# Patient Record
Sex: Female | Born: 1970 | Race: Black or African American | Hispanic: No | Smoking: Current every day smoker
Health system: Southern US, Community
[De-identification: ages and names within clinical notes are randomized; demographics above are authoritative.]

## PROBLEM LIST (undated history)

## (undated) HISTORY — PX: CARPAL TUNNEL RELEASE: SHX101

## (undated) HISTORY — PX: CHOLECYSTECTOMY: SHX55

## (undated) HISTORY — PX: ABDOMINAL HYSTERECTOMY: SHX81

---

## 2019-10-07 ENCOUNTER — Other Ambulatory Visit: Payer: Self-pay

## 2019-10-07 ENCOUNTER — Emergency Department (HOSPITAL_BASED_OUTPATIENT_CLINIC_OR_DEPARTMENT_OTHER)
Admission: EM | Admit: 2019-10-07 | Discharge: 2019-10-07 | Disposition: A | Discharge status: Home / Self Care | Payer: BLUE CROSS/BLUE SHIELD | Attending: Emergency Medicine | Admitting: Emergency Medicine

## 2019-10-07 ENCOUNTER — Emergency Department (HOSPITAL_BASED_OUTPATIENT_CLINIC_OR_DEPARTMENT_OTHER): Payer: BLUE CROSS/BLUE SHIELD

## 2019-10-07 ENCOUNTER — Encounter (HOSPITAL_BASED_OUTPATIENT_CLINIC_OR_DEPARTMENT_OTHER): Payer: Self-pay | Admitting: Emergency Medicine

## 2019-10-07 DIAGNOSIS — Z885 Allergy status to narcotic agent status: Secondary | ICD-10-CM | POA: Diagnosis not present

## 2019-10-07 DIAGNOSIS — F17210 Nicotine dependence, cigarettes, uncomplicated: Secondary | ICD-10-CM | POA: Diagnosis not present

## 2019-10-07 DIAGNOSIS — R42 Dizziness and giddiness: Secondary | ICD-10-CM | POA: Diagnosis present

## 2019-10-07 DIAGNOSIS — Z91018 Allergy to other foods: Secondary | ICD-10-CM | POA: Diagnosis not present

## 2019-10-07 DIAGNOSIS — G44209 Tension-type headache, unspecified, not intractable: Secondary | ICD-10-CM | POA: Diagnosis not present

## 2019-10-07 LAB — CBC WITH DIFFERENTIAL/PLATELET
Abs Immature Granulocytes: 0.01 10*3/uL (ref 0.00–0.07)
Basophils Absolute: 0.1 10*3/uL (ref 0.0–0.1)
Basophils Relative: 1 %
Eosinophils Absolute: 0.1 10*3/uL (ref 0.0–0.5)
Eosinophils Relative: 2 %
HCT: 46.6 % — ABNORMAL HIGH (ref 36.0–46.0)
Hemoglobin: 15.7 g/dL — ABNORMAL HIGH (ref 12.0–15.0)
Immature Granulocytes: 0 %
Lymphocytes Relative: 54 %
Lymphs Abs: 3.7 10*3/uL (ref 0.7–4.0)
MCH: 30.9 pg (ref 26.0–34.0)
MCHC: 33.7 g/dL (ref 30.0–36.0)
MCV: 91.7 fL (ref 80.0–100.0)
Monocytes Absolute: 0.5 10*3/uL (ref 0.1–1.0)
Monocytes Relative: 7 %
Neutro Abs: 2.5 10*3/uL (ref 1.7–7.7)
Neutrophils Relative %: 36 %
Platelets: 171 10*3/uL (ref 150–400)
RBC: 5.08 MIL/uL (ref 3.87–5.11)
RDW: 12.9 % (ref 11.5–15.5)
WBC: 6.9 10*3/uL (ref 4.0–10.5)
nRBC: 0 % (ref 0.0–0.2)

## 2019-10-07 LAB — BASIC METABOLIC PANEL
Anion gap: 7 (ref 5–15)
BUN: 17 mg/dL (ref 6–20)
CO2: 24 mmol/L (ref 22–32)
Calcium: 9 mg/dL (ref 8.9–10.3)
Chloride: 112 mmol/L — ABNORMAL HIGH (ref 98–111)
Creatinine, Ser: 0.93 mg/dL (ref 0.44–1.00)
GFR calc Af Amer: >60 mL/min (ref >60)
GFR calc non Af Amer: >60 mL/min (ref >60)
Glucose, Bld: 109 mg/dL — ABNORMAL HIGH (ref 70–99)
Potassium: 3.8 mmol/L (ref 3.5–5.1)
Sodium: 143 mmol/L (ref 135–145)

## 2019-10-07 MED ORDER — IOHEXOL 350 MG/ML SOLN
75.0000 mL | Freq: Once | INTRAVENOUS | Status: AC | PRN
  Administered 2019-10-07: 22:00:00 100 mL via INTRAVENOUS

## 2019-10-07 MED ORDER — ACETAMINOPHEN 500 MG PO TABS
500.0000 mg | ORAL_TABLET | Freq: Once | ORAL | Status: AC
  Administered 2019-10-07: 500 mg via ORAL
  Filled 2019-10-07: qty 1

## 2019-10-07 MED ORDER — NAPROXEN 500 MG PO TABS: 500.0000 mg | ORAL_TABLET | Freq: Two times a day (BID) | ORAL | 0 refills | Status: AC | PRN

## 2019-10-07 MED ORDER — DIPHENHYDRAMINE HCL 50 MG/ML IJ SOLN
25.0000 mg | Freq: Once | INTRAMUSCULAR | Status: AC
  Administered 2019-10-07: 25 mg via INTRAVENOUS
  Filled 2019-10-07: qty 1

## 2019-10-07 MED ORDER — PROCHLORPERAZINE EDISYLATE 10 MG/2ML IJ SOLN
10.0000 mg | Freq: Once | INTRAMUSCULAR | Status: AC
  Administered 2019-10-07: 10 mg via INTRAVENOUS
  Filled 2019-10-07: qty 2

## 2019-10-07 NOTE — ED Triage Notes (Signed)
Has been having dizziness yesterday at work, woke up this am it was worse, with blurred vision. Dizziness worse when turning head from side to side, denies n/v or head trauma. Also c/o of h/a

## 2019-10-07 NOTE — Discharge Instructions (Signed)
Take Tylenol and be prescribed naproxen for pain control.  If you develop worsening dizziness, episodes of passing out, chest pain, difficulty breathing, numbness, weakness or other new concerning symptom, please return to ER for reassessment.

## 2019-10-07 NOTE — ED Notes (Signed)
Pt to CT at this time.

## 2019-10-07 NOTE — ED Provider Notes (Signed)
[facility] EMERGENCY DEPARTMENT Provider Note   CSN: [id number] Arrival date & time: 10/07/19  2036     History   Chief Complaint Chief Complaint  Patient presents with   Dizziness    HPI Latasha Mcdaniel is a 48 y.o. female.  Presents emerge department with new onset headache, neck pain.  Patient states woke up this morning with new severe headache, right-sided, extended down to right lateral neck.  States headache has been relatively constant throughout the day, up to 10 out of 10 in severity, currently 8 out of 10 in severity.  Worse with bright lights.  Has had associated episodes of dizzy spells.  States these occur whenever she has sudden movements.  Currently not feeling dizzy.  No episodes of passing out, no chest pain or difficulty breathing.  No numbness, weakness, vision changes, gait changes.  Tried taking Motrin earlier today, moderate improvement in symptoms. No fever, no neck stiffness.    HPI  History reviewed. No pertinent past medical history.  There are no active problems to display for this patient.   Past Surgical History:  Procedure Laterality Date   ABDOMINAL HYSTERECTOMY     CARPAL TUNNEL RELEASE Bilateral    CHOLECYSTECTOMY       OB History   No obstetric history on file.      Home Medications    Prior to Admission medications   Medication Sig Start Date End Date Taking? Authorizing Provider  naproxen (NAPROSYN) 500 MG tablet Take 1 tablet (500 mg total) by mouth 2 (two) times daily as needed. 10/07/19   Milagros Loll, MD    Family History No family history on file.  Social History Social History   Tobacco Use   Smoking status: Current Every Day Smoker    Packs/day: 0.50    Types: Cigarettes   Smokeless tobacco: Never Used  Substance Use Topics   Alcohol use: Yes    Comment: daily wine   Drug use: Never     Allergies   Fish allergy and Lortab [hydrocodone-acetaminophen]   Review of Systems Review of  Systems  Constitutional: Negative for chills and fever.  HENT: Negative for ear pain and sore throat.   Eyes: Negative for pain and visual disturbance.  Respiratory: Negative for cough and shortness of breath.   Cardiovascular: Negative for chest pain and palpitations.  Gastrointestinal: Negative for abdominal pain and vomiting.  Genitourinary: Negative for dysuria and hematuria.  Musculoskeletal: Negative for arthralgias and back pain.  Skin: Negative for color change and rash.  Neurological: Positive for headaches. Negative for seizures and syncope.  All other systems reviewed and are negative.    Physical Exam Updated Vital Signs BP (!) 158/98 (BP Location: Right Arm)    Pulse 90    Temp 98.2 F (36.8 C) (Oral)    Resp 18    Ht  (1.549 m)    Wt 93.1 kg    SpO2 99%    BMI 38.77 kg/m   Physical Exam Vitals signs and nursing note reviewed.  Constitutional:      General: She is not in acute distress.    Appearance: She is well-developed.  HENT:     Head: Normocephalic and atraumatic.  Eyes:     Conjunctiva/sclera: Conjunctivae normal.  Neck:     Musculoskeletal: Neck supple.     Comments: Normal neck range of motion, no midline C-spine tenderness Cardiovascular:     Rate and Rhythm: Normal rate and regular rhythm.  Heart sounds: No murmur.  Pulmonary:     Effort: Pulmonary effort is normal. No respiratory distress.     Breath sounds: Normal breath sounds.  Abdominal:     Palpations: Abdomen is soft.     Tenderness: There is no abdominal tenderness.  Skin:    General: Skin is warm and dry.  Neurological:     Mental Status: She is alert.     Comments: Alert, oriented x3, cranial nerves II through XII intact, 5 out of 5 strength in bilateral upper and lower extremities, sensation to light touch intact in all 4 extremities, normal finger-nose-finger, normal gait, no pronator drift, normal visual fields, normal speech      ED Treatments / Results  Labs (all labs  ordered are listed, but only abnormal results are displayed) Labs Reviewed  CBC WITH DIFFERENTIAL/PLATELET - Abnormal; Notable for the following components:      Result Value   Hemoglobin 15.7 (*)    HCT 46.6 (*)    All other components within normal limits  BASIC METABOLIC PANEL - Abnormal; Notable for the following components:   Chloride 112 (*)    Glucose, Bld 109 (*)    All other components within normal limits    EKG None  Radiology Ct Angio Head W Or Wo Contrast  Result Date: 10/07/2019 CLINICAL DATA:  Thunderclap headache EXAM: CT ANGIOGRAPHY HEAD AND NECK TECHNIQUE: Multidetector CT imaging of the head and neck was performed using the standard protocol during bolus administration of intravenous contrast. Multiplanar CT image reconstructions and MIPs were obtained to evaluate the vascular anatomy. Carotid stenosis measurements (when applicable) are obtained utilizing NASCET criteria, using the distal internal carotid diameter as the denominator. CONTRAST:  100mL OMNIPAQUE IOHEXOL 350 MG/ML SOLN COMPARISON:  None. FINDINGS: CT HEAD FINDINGS Brain: There is no mass, hemorrhage or extra-axial collection. The size and configuration of the ventricles and extra-axial CSF spaces are normal. There is no acute or chronic infarction. The brain parenchyma is normal. Skull: The visualized skull base, calvarium and extracranial soft tissues are normal. Sinuses/Orbits: No fluid levels or advanced mucosal thickening of the visualized paranasal sinuses. No mastoid or middle ear effusion. The orbits are normal. CTA NECK FINDINGS SKELETON: There is no bony spinal canal stenosis. No lytic or blastic lesion. OTHER NECK: Normal pharynx, larynx and major salivary glands. No cervical lymphadenopathy. Unremarkable thyroid gland. UPPER CHEST: No pneumothorax or pleural effusion. No nodules or masses. AORTIC ARCH: There is no calcific atherosclerosis of the aortic arch. There is no aneurysm, dissection or  hemodynamically significant stenosis of the visualized portion of the aorta. Conventional 3 vessel aortic branching pattern. The visualized proximal subclavian arteries are widely patent. RIGHT CAROTID SYSTEM: Normal without aneurysm, dissection or stenosis. LEFT CAROTID SYSTEM: Normal without aneurysm, dissection or stenosis. VERTEBRAL ARTERIES: Left dominant configuration. Both origins are clearly patent. There is no dissection, occlusion or flow-limiting stenosis to the skull base (V1-V3 segments). CTA HEAD FINDINGS POSTERIOR CIRCULATION: --Vertebral arteries: Normal V4 segments. --Posterior inferior cerebellar arteries (PICA): Patent origins from the vertebral arteries. --Anterior inferior cerebellar arteries (AICA): Patent origins from the basilar artery. --Basilar artery: Normal. --Superior cerebellar arteries: Normal. --Posterior cerebral arteries: Normal. The right PCA is predominantly supplied by the posterior communicating artery. ANTERIOR CIRCULATION: --Intracranial internal carotid arteries: Normal. --Anterior cerebral arteries (ACA): Normal. Both A1 segments are present. Patent anterior communicating artery (a-comm). --Middle cerebral arteries (MCA): Normal. VENOUS SINUSES: As permitted by contrast timing, patent. ANATOMIC VARIANTS: Fetal origin of the right PCA Review  of the MIP images confirms the above findings. IMPRESSION: Normal CTA of the head and neck. Electronically Signed   By: Ulyses Jarred M.D.   On: 10/07/2019 22:31   Ct Angio Neck W And/or Wo Contrast  Result Date: 10/07/2019 CLINICAL DATA:  Thunderclap headache EXAM: CT ANGIOGRAPHY HEAD AND NECK TECHNIQUE: Multidetector CT imaging of the head and neck was performed using the standard protocol during bolus administration of intravenous contrast. Multiplanar CT image reconstructions and MIPs were obtained to evaluate the vascular anatomy. Carotid stenosis measurements (when applicable) are obtained utilizing NASCET criteria, using the  distal internal carotid diameter as the denominator. CONTRAST:  129mL OMNIPAQUE IOHEXOL 350 MG/ML SOLN COMPARISON:  None. FINDINGS: CT HEAD FINDINGS Brain: There is no mass, hemorrhage or extra-axial collection. The size and configuration of the ventricles and extra-axial CSF spaces are normal. There is no acute or chronic infarction. The brain parenchyma is normal. Skull: The visualized skull base, calvarium and extracranial soft tissues are normal. Sinuses/Orbits: No fluid levels or advanced mucosal thickening of the visualized paranasal sinuses. No mastoid or middle ear effusion. The orbits are normal. CTA NECK FINDINGS SKELETON: There is no bony spinal canal stenosis. No lytic or blastic lesion. OTHER NECK: Normal pharynx, larynx and major salivary glands. No cervical lymphadenopathy. Unremarkable thyroid gland. UPPER CHEST: No pneumothorax or pleural effusion. No nodules or masses. AORTIC ARCH: There is no calcific atherosclerosis of the aortic arch. There is no aneurysm, dissection or hemodynamically significant stenosis of the visualized portion of the aorta. Conventional 3 vessel aortic branching pattern. The visualized proximal subclavian arteries are widely patent. RIGHT CAROTID SYSTEM: Normal without aneurysm, dissection or stenosis. LEFT CAROTID SYSTEM: Normal without aneurysm, dissection or stenosis. VERTEBRAL ARTERIES: Left dominant configuration. Both origins are clearly patent. There is no dissection, occlusion or flow-limiting stenosis to the skull base (V1-V3 segments). CTA HEAD FINDINGS POSTERIOR CIRCULATION: --Vertebral arteries: Normal V4 segments. --Posterior inferior cerebellar arteries (PICA): Patent origins from the vertebral arteries. --Anterior inferior cerebellar arteries (AICA): Patent origins from the basilar artery. --Basilar artery: Normal. --Superior cerebellar arteries: Normal. --Posterior cerebral arteries: Normal. The right PCA is predominantly supplied by the posterior  communicating artery. ANTERIOR CIRCULATION: --Intracranial internal carotid arteries: Normal. --Anterior cerebral arteries (ACA): Normal. Both A1 segments are present. Patent anterior communicating artery (a-comm). --Middle cerebral arteries (MCA): Normal. VENOUS SINUSES: As permitted by contrast timing, patent. ANATOMIC VARIANTS: Fetal origin of the right PCA Review of the MIP images confirms the above findings. IMPRESSION: Normal CTA of the head and neck. Electronically Signed   By: Ulyses Jarred M.D.   On: 10/07/2019 22:31    Procedures Procedures (including critical care time)  Medications Ordered in ED Medications  prochlorperazine (COMPAZINE) injection 10 mg (10 mg Intravenous Given 10/07/19 2130)  diphenhydrAMINE (BENADRYL) injection 25 mg (25 mg Intravenous Given 10/07/19 2130)  acetaminophen (TYLENOL) tablet 500 mg (500 mg Oral Given 10/07/19 2130)  iohexol (OMNIPAQUE) 350 MG/ML injection 75 mL (100 mLs Intravenous Contrast Given 10/07/19 2205)     Initial Impression / Assessment and Plan / ED Course  I have reviewed the triage vital signs and the nursing notes.  Pertinent labs & imaging results that were available during my care of the patient were reviewed by me and considered in my medical decision making (see chart for details).        48 year old lady presents to ER with acute onset severe right-sided headache, some associated right-sided neck pain as well.  On exam well-appearing, no focal neuro deficits.  No prior history of headaches or migraines.  Symptoms since this morning.  CTA head and neck negative for subarachnoid, aneurysm, carotid dissection.  Suspect tension type headache with occasional episodes of positional vertigo.  Patient symptoms improved after headache cocktail.  She remains well-appearing with normal vital signs, believe appropriate for discharge and outpatient management this time.    After the discussed management above, the patient was determined to  be safe for discharge.  The patient was in agreement with this plan and all questions regarding their care were answered.  ED return precautions were discussed and the patient will return to the ED with any significant worsening of condition.    Final Clinical Impressions(s) / ED Diagnoses   Final diagnoses:  Tension headache  Dizziness    ED Discharge Orders         Ordered    naproxen (NAPROSYN) 500 MG tablet  2 times daily PRN     10/07/19 2255           Milagros Loll, MD 10/07/19 2317

## 2019-10-07 NOTE — ED Notes (Signed)
MD at bedside discussing results. 

## 2019-10-07 NOTE — ED Notes (Signed)
ED Provider at bedside. 

## 2020-12-07 IMAGING — CT CT ANGIO HEAD
1 of 13 series · 3 of 33 positions shown · IV contrast (Omnipaque)
Comparison: None.

CLINICAL DATA: Thunderclap headache

EXAM:
CT ANGIOGRAPHY HEAD AND NECK
TECHNIQUE: Multidetector CT imaging of the head and neck was performed using
the standard protocol during bolus administration of intravenous
contrast. Multiplanar CT image reconstructions and MIPs were
obtained to evaluate the vascular anatomy. Carotid stenosis
measurements (when applicable) are obtained utilizing NASCET
criteria, using the distal internal carotid diameter as the
denominator.
CONTRAST:  100mL OMNIPAQUE IOHEXOL 350 MG/ML SOLN

[Series 11: axial thin · axial · 0.39mm/px · z∈[+442,+767]mm · 3 of 326 slices shown]
[im 1/326  soft-tissue]
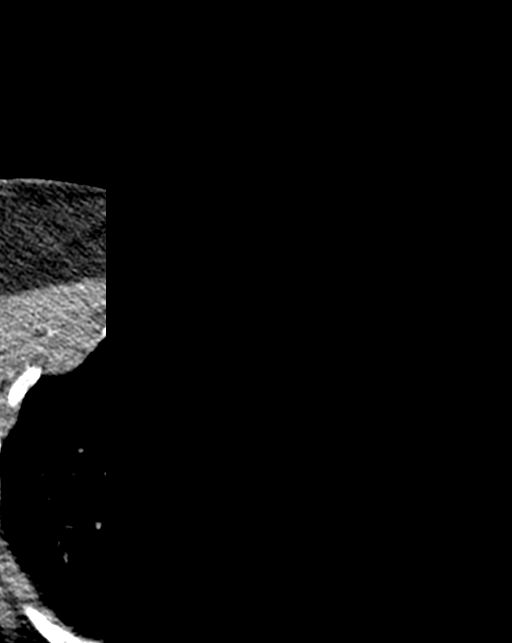
[im 163/326  bone]
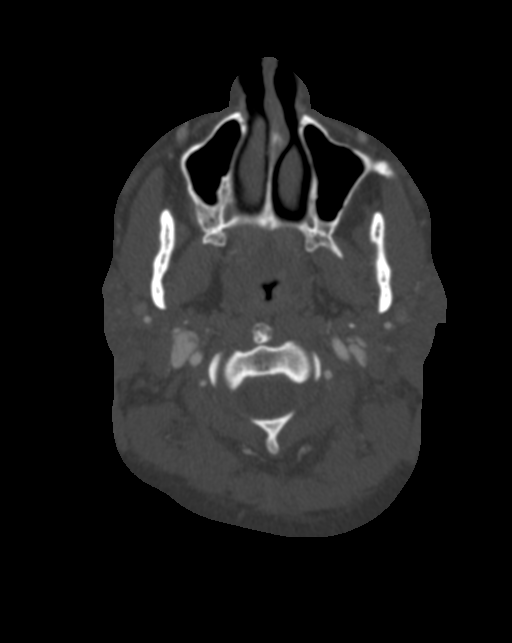
[im 326/326  soft-tissue]
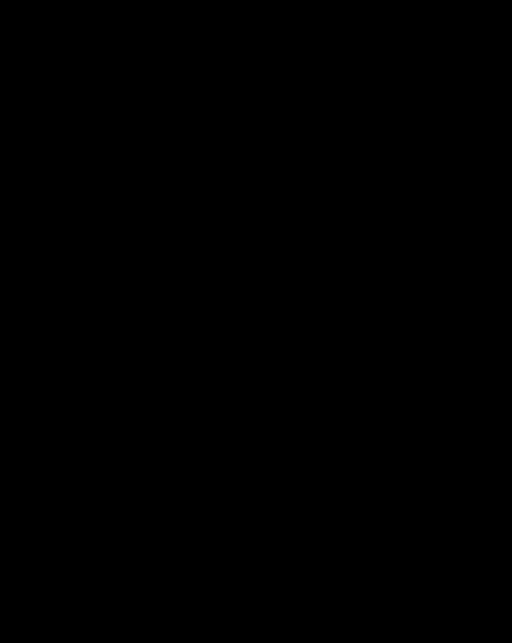

[3 of 33 positions shown; findings below may reference images not displayed]

FINDINGS: CT HEAD FINDINGS

Brain: There is no mass, hemorrhage or extra-axial collection. The
size and configuration of the ventricles and extra-axial CSF spaces
are normal. There is no acute or chronic infarction. The brain
parenchyma is normal.

Skull: The visualized skull base, calvarium and extracranial soft
tissues are normal.

Sinuses/Orbits: No fluid levels or advanced mucosal thickening of
the visualized paranasal sinuses. No mastoid or middle ear effusion.
The orbits are normal.

CTA NECK FINDINGS

SKELETON: There is no bony spinal canal stenosis. No lytic or
blastic lesion.

OTHER NECK: Normal pharynx, larynx and major salivary glands. No
cervical lymphadenopathy. Unremarkable thyroid gland.

UPPER CHEST: No pneumothorax or pleural effusion. No nodules or
masses.

AORTIC ARCH:

There is no calcific atherosclerosis of the aortic arch. There is no
aneurysm, dissection or hemodynamically significant stenosis of the
visualized portion of the aorta. Conventional 3 vessel aortic
branching pattern. The visualized proximal subclavian arteries are
widely patent.

RIGHT CAROTID SYSTEM: Normal without aneurysm, dissection or
stenosis.

LEFT CAROTID SYSTEM: Normal without aneurysm, dissection or
stenosis.

VERTEBRAL ARTERIES: Left dominant configuration. Both origins are
clearly patent. There is no dissection, occlusion or flow-limiting
stenosis to the skull base (V1-V3 segments).

CTA HEAD FINDINGS

POSTERIOR CIRCULATION:

--Vertebral arteries: Normal V4 segments.

--Posterior inferior cerebellar arteries (PICA): Patent origins from
the vertebral arteries.

--Anterior inferior cerebellar arteries (AICA): Patent origins from
the basilar artery.

--Basilar artery: Normal.

--Superior cerebellar arteries: Normal.

--Posterior cerebral arteries: Normal. The right PCA is
predominantly supplied by the posterior communicating artery.

ANTERIOR CIRCULATION:

--Intracranial internal carotid arteries: Normal.

--Anterior cerebral arteries (ACA): Normal. Both A1 segments are
present. Patent anterior communicating artery (a-comm).

--Middle cerebral arteries (MCA): Normal.

VENOUS SINUSES: As permitted by contrast timing, patent.

ANATOMIC VARIANTS: Fetal origin of the right PCA

Review of the MIP images confirms the above findings.
IMPRESSION: Normal CTA of the head and neck.

## 2020-12-07 IMAGING — CT CT ANGIO NECK
1 of 11 series · 5 of 33 positions shown · IV contrast (Omnipaque)
Comparison: None.

CLINICAL DATA: Thunderclap headache

EXAM:
CT ANGIOGRAPHY HEAD AND NECK
TECHNIQUE: Multidetector CT imaging of the head and neck was performed using
the standard protocol during bolus administration of intravenous
contrast. Multiplanar CT image reconstructions and MIPs were
obtained to evaluate the vascular anatomy. Carotid stenosis
measurements (when applicable) are obtained utilizing NASCET
criteria, using the distal internal carotid diameter as the
denominator.
CONTRAST:  100mL OMNIPAQUE IOHEXOL 350 MG/ML SOLN

[Series 11: axial thin · axial · 0.39mm/px · z∈[+496,+712]mm · 5 of 326 slices shown]
[im 55/326  soft-tissue]
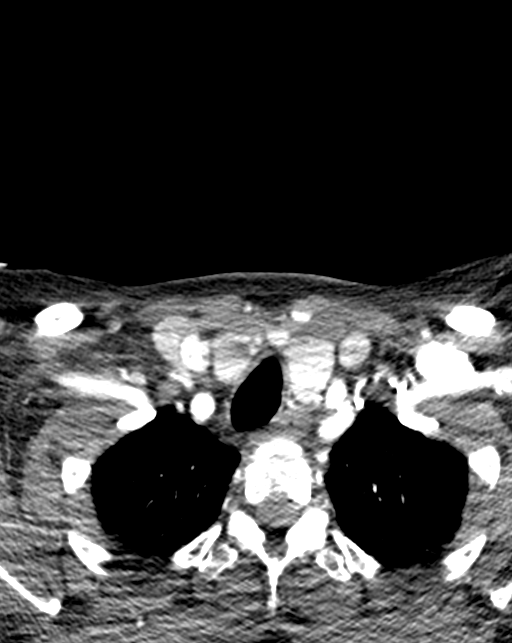
[im 109/326  bone]
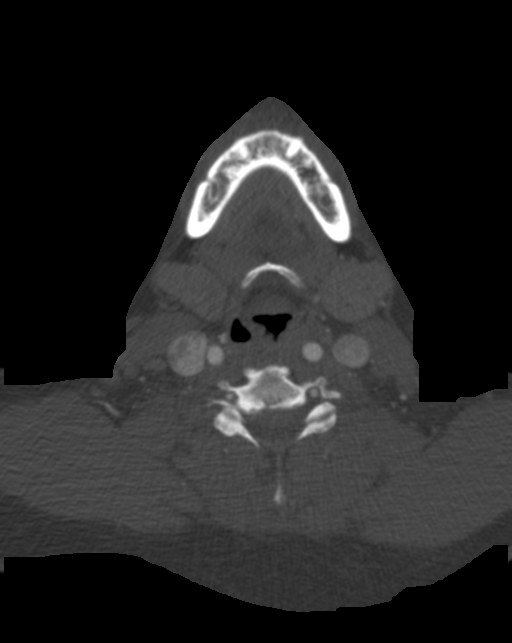
[im 163/326  soft-tissue]
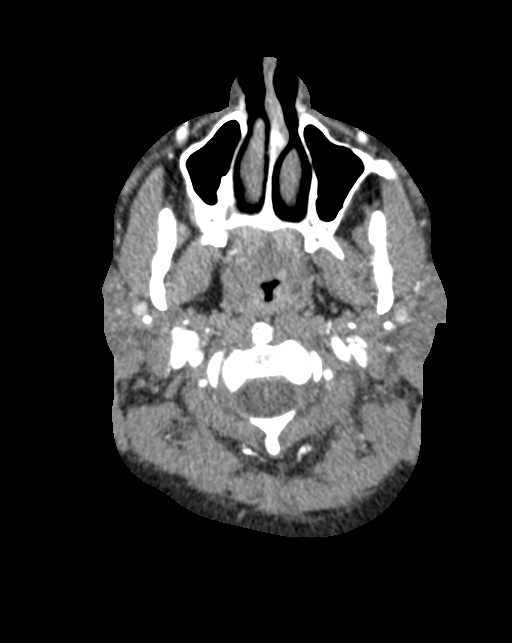
[im 217/326  bone]
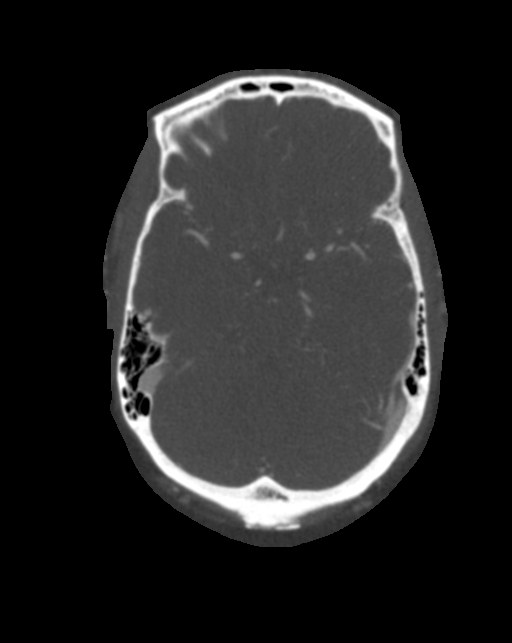
[im 271/326  soft-tissue]
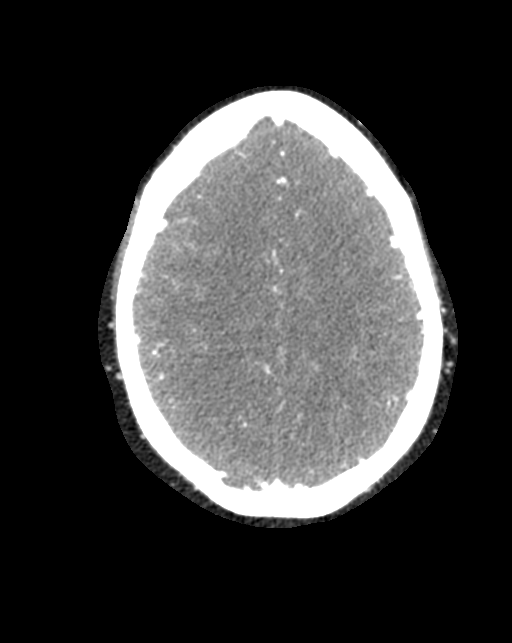

[5 of 33 positions shown; findings below may reference images not displayed]

FINDINGS: CT HEAD FINDINGS

Brain: There is no mass, hemorrhage or extra-axial collection. The
size and configuration of the ventricles and extra-axial CSF spaces
are normal. There is no acute or chronic infarction. The brain
parenchyma is normal.

Skull: The visualized skull base, calvarium and extracranial soft
tissues are normal.

Sinuses/Orbits: No fluid levels or advanced mucosal thickening of
the visualized paranasal sinuses. No mastoid or middle ear effusion.
The orbits are normal.

CTA NECK FINDINGS

SKELETON: There is no bony spinal canal stenosis. No lytic or
blastic lesion.

OTHER NECK: Normal pharynx, larynx and major salivary glands. No
cervical lymphadenopathy. Unremarkable thyroid gland.

UPPER CHEST: No pneumothorax or pleural effusion. No nodules or
masses.

AORTIC ARCH:

There is no calcific atherosclerosis of the aortic arch. There is no
aneurysm, dissection or hemodynamically significant stenosis of the
visualized portion of the aorta. Conventional 3 vessel aortic
branching pattern. The visualized proximal subclavian arteries are
widely patent.

RIGHT CAROTID SYSTEM: Normal without aneurysm, dissection or
stenosis.

LEFT CAROTID SYSTEM: Normal without aneurysm, dissection or
stenosis.

VERTEBRAL ARTERIES: Left dominant configuration. Both origins are
clearly patent. There is no dissection, occlusion or flow-limiting
stenosis to the skull base (V1-V3 segments).

CTA HEAD FINDINGS

POSTERIOR CIRCULATION:

--Vertebral arteries: Normal V4 segments.

--Posterior inferior cerebellar arteries (PICA): Patent origins from
the vertebral arteries.

--Anterior inferior cerebellar arteries (AICA): Patent origins from
the basilar artery.

--Basilar artery: Normal.

--Superior cerebellar arteries: Normal.

--Posterior cerebral arteries: Normal. The right PCA is
predominantly supplied by the posterior communicating artery.

ANTERIOR CIRCULATION:

--Intracranial internal carotid arteries: Normal.

--Anterior cerebral arteries (ACA): Normal. Both A1 segments are
present. Patent anterior communicating artery (a-comm).

--Middle cerebral arteries (MCA): Normal.

VENOUS SINUSES: As permitted by contrast timing, patent.

ANATOMIC VARIANTS: Fetal origin of the right PCA

Review of the MIP images confirms the above findings.
IMPRESSION: Normal CTA of the head and neck.
